# Patient Record
Sex: Male | Born: 1960 | Race: Black or African American | Hispanic: No | Marital: Married | Smoking: Former smoker
Health system: Southern US, Community
[De-identification: ages and names within clinical notes are randomized; demographics above are authoritative.]

## PROBLEM LIST (undated history)

## (undated) DIAGNOSIS — I10 Essential (primary) hypertension: Secondary | ICD-10-CM

## (undated) DIAGNOSIS — C801 Malignant (primary) neoplasm, unspecified: Secondary | ICD-10-CM

## (undated) DIAGNOSIS — F32A Depression, unspecified: Secondary | ICD-10-CM

## (undated) DIAGNOSIS — F419 Anxiety disorder, unspecified: Secondary | ICD-10-CM

## (undated) DIAGNOSIS — G4730 Sleep apnea, unspecified: Secondary | ICD-10-CM

---

## 2000-05-07 ENCOUNTER — Emergency Department (HOSPITAL_COMMUNITY): Admission: EM | Admit: 2000-05-07 | Discharge: 2000-05-07 | Payer: Self-pay | Admitting: Emergency Medicine

## 2000-05-07 ENCOUNTER — Encounter: Payer: Self-pay | Admitting: Emergency Medicine

## 2005-09-23 ENCOUNTER — Emergency Department (HOSPITAL_COMMUNITY): Admission: EM | Admit: 2005-09-23 | Discharge: 2005-09-23 | Payer: Self-pay | Admitting: Family Medicine

## 2005-09-25 ENCOUNTER — Ambulatory Visit: Payer: Self-pay | Admitting: Internal Medicine

## 2005-10-09 ENCOUNTER — Ambulatory Visit: Payer: Self-pay | Admitting: Internal Medicine

## 2006-07-05 ENCOUNTER — Emergency Department (HOSPITAL_COMMUNITY): Admission: EM | Admit: 2006-07-05 | Discharge: 2006-07-05 | Payer: Self-pay | Admitting: Emergency Medicine

## 2006-07-21 ENCOUNTER — Encounter: Admission: RE | Admit: 2006-07-21 | Discharge: 2006-07-21 | Payer: Self-pay | Admitting: Orthopedic Surgery

## 2015-09-14 ENCOUNTER — Other Ambulatory Visit: Payer: Self-pay | Admitting: Occupational Medicine

## 2015-09-14 ENCOUNTER — Ambulatory Visit: Payer: Self-pay

## 2015-09-14 DIAGNOSIS — M25562 Pain in left knee: Secondary | ICD-10-CM

## 2021-02-10 ENCOUNTER — Ambulatory Visit (HOSPITAL_COMMUNITY)
Admission: EM | Admit: 2021-02-10 | Discharge: 2021-02-10 | Disposition: A | Discharge status: Home / Self Care | Payer: Medicare Other | Attending: Emergency Medicine | Admitting: Emergency Medicine

## 2021-02-10 ENCOUNTER — Encounter (HOSPITAL_COMMUNITY): Payer: Self-pay

## 2021-02-10 ENCOUNTER — Ambulatory Visit (INDEPENDENT_AMBULATORY_CARE_PROVIDER_SITE_OTHER): Payer: Medicare Other

## 2021-02-10 ENCOUNTER — Other Ambulatory Visit: Payer: Self-pay

## 2021-02-10 DIAGNOSIS — N200 Calculus of kidney: Secondary | ICD-10-CM

## 2021-02-10 DIAGNOSIS — R10A0 Flank pain, unspecified side: Secondary | ICD-10-CM

## 2021-02-10 DIAGNOSIS — R109 Unspecified abdominal pain: Secondary | ICD-10-CM | POA: Diagnosis not present

## 2021-02-10 LAB — BASIC METABOLIC PANEL
Anion gap: 6 (ref 5–15)
BUN: 11 mg/dL (ref 6–20)
CO2: 26 mmol/L (ref 22–32)
Calcium: 9.1 mg/dL (ref 8.9–10.3)
Chloride: 105 mmol/L (ref 98–111)
Creatinine, Ser: 1.24 mg/dL (ref 0.61–1.24)
GFR, Estimated: >60 mL/min (ref >60)
Glucose, Bld: 104 mg/dL — ABNORMAL HIGH (ref 70–99)
Potassium: 4.4 mmol/L (ref 3.5–5.1)
Sodium: 137 mmol/L (ref 135–145)

## 2021-02-10 LAB — POCT URINALYSIS DIPSTICK, ED / UC
Glucose, UA: NEGATIVE mg/dL
Ketones, ur: 40 mg/dL — AB
Leukocytes,Ua: NEGATIVE
Nitrite: POSITIVE — AB
Protein, ur: 30 mg/dL — AB
Specific Gravity, Urine: >=1.03 (ref 1.005–1.030)
Urobilinogen, UA: 0.2 mg/dL (ref 0.0–1.0)
pH: 5 (ref 5.0–8.0)

## 2021-02-10 NOTE — ED Triage Notes (Signed)
Pt reports right  flank paini and right sided lower back pain x 4 days. Pain worse when siting. Ibuprofen gives relief. Denies dysuria, increase urinary frequency.

## 2021-02-10 NOTE — ED Provider Notes (Signed)
[facility]    CSN: [id number] Arrival date & time: 02/10/21  0825      History   Chief Complaint Chief Complaint  Patient presents with  . Back Pain  . Flank Pain    HPI CAMBRIDGE DELEO is a 60 y.o. male.   Patient here for evaluation of right flank or side pain that has been ongoing for the past several days.  Reports taking ibuprofen with some relief.  Reports pain is worse when sitting.  Has tried stretching and gentle exercise with no relief.  Denies any dysuria, urgency, or frequently.  Denies any difficulty urinating or hematuria.  Denies any fevers, chest pain, shortness of breath, N/V/D, numbness, tingling, weakness, abdominal pain, or headaches.   ROS: As per HPI, all other pertinent ROS negative   The history is provided by the patient.  Back Pain Flank Pain    History reviewed. No pertinent past medical history.  There are no problems to display for this patient.   Past Surgical History:  Procedure Laterality Date  . KNEE SURGERY Left        Home Medications    Prior to Admission medications   Medication Sig Start Date End Date Taking? Authorizing Provider  ibuprofen (ADVIL) 200 MG tablet Take 200 mg by mouth every 6 (six) hours as needed.   Yes [provider]    Family History History reviewed. No pertinent family history.  Social History Social History   Tobacco Use  . Smoking status: Never Smoker  . Smokeless tobacco: Never Used  Substance Use Topics  . Alcohol use: Never  . Drug use: Never     Allergies   Patient has no known allergies.   Review of Systems Review of Systems  Genitourinary: Positive for flank pain.  Musculoskeletal: Positive for back pain.  All other systems reviewed and are negative.    Physical Exam Triage Vital Signs ED Triage Vitals  Enc Vitals Group     BP 02/10/21 0941 (!) 144/80     Pulse Rate 02/10/21 0941 83     Resp 02/10/21 0941 20     Temp 02/10/21 0941 98.2 F (36.8  C)     Temp Source 02/10/21 0941 Oral     SpO2 02/10/21 0941 98 %     Weight --      Height --      Head Circumference --      Peak Flow --      Pain Score 02/10/21 0939 6     Pain Loc --      Pain Edu? --      Excl. in [place]? --    No data found.  Updated Vital Signs BP (!) 144/80 (BP Location: Right Arm)   Pulse 83   Temp 98.2 F (36.8 C) (Oral)   Resp 20   SpO2 98%   Visual Acuity Right Eye Distance:   Left Eye Distance:   Bilateral Distance:    Right Eye Near:   Left Eye Near:    Bilateral Near:     Physical Exam Vitals and nursing note reviewed.  Constitutional:      General: He is not in acute distress.    Appearance: Normal appearance. He is not ill-appearing, toxic-appearing or diaphoretic.  HENT:     Head: Normocephalic and atraumatic.  Eyes:     Conjunctiva/sclera: Conjunctivae normal.  Cardiovascular:     Rate and Rhythm: Normal rate.     Pulses: Normal pulses.  Pulmonary:     Effort: Pulmonary effort is normal.  Abdominal:     General: Abdomen is flat.     Tenderness: There is no right CVA tenderness or left CVA tenderness.  Musculoskeletal:        General: Normal range of motion.     Cervical back: Normal range of motion.  Skin:    General: Skin is warm and dry.  Neurological:     General: No focal deficit present.     Mental Status: He is alert and oriented to person, place, and time.  Psychiatric:        Mood and Affect: Mood normal.      UC Treatments / Results  Labs (all labs ordered are listed, but only abnormal results are displayed) Labs Reviewed  POCT URINALYSIS DIPSTICK, ED / UC - Abnormal; Notable for the following components:      Result Value   Bilirubin Urine SMALL (*)    Ketones, ur 40 (*)    Hgb urine dipstick LARGE (*)    Protein, ur 30 (*)    Nitrite POSITIVE (*)    All other components within normal limits  URINE CULTURE  BASIC METABOLIC PANEL    EKG   Radiology DG Abd 2 Views  Result Date:  02/10/2021 CLINICAL DATA:  Right flank pain. EXAM: ABDOMEN - 2 VIEW COMPARISON:  None. FINDINGS: Nonobstructive bowel gas pattern. Large amount of stool in the right abdomen and transverse colon region. There is at least 1 stone in the left kidney region that measures up to 9 mm. Evidence for 3 calcifications in the right renal region, largest measuring 7 mm. Multiple calcifications in the pelvis likely represent phleboliths. IMPRESSION: 1. Bilateral renal calculi. 2. Nonobstructive bowel gas pattern. Large amount of stool in the abdomen. Electronically Signed   By: Markus Daft M.D.   On: 02/10/2021 10:24    Procedures Procedures (including critical care time)  Medications Ordered in UC Medications - No data to display  Initial Impression / Assessment and Plan / UC Course  I have reviewed the triage vital signs and the nursing notes.  Pertinent labs & imaging results that were available during my care of the patient were reviewed by me and considered in my medical decision making (see chart for details).     Assessment primarily unremarkable with no red flags or concerns.  Urinalysis was positive for nitrites, bilirubin, ketones, protein, and hemoglobin.  Urine culture pending.  Abdominal x-ray shows bilateral renal calculi and a large amount of stool.  As patient denies any difficulty urinating at this time encouraged him to increase fluid intake.  May continue taking ibuprofen as needed for pain.  Will obtain BMP to ensure adequate kidney function.  Patient does not have primary care so primary care assistance started.  May require to urology in the future.  Strict ED follow-up for any worsening symptoms including development of fever, abdominal pain, nausea/vomiting, hematuria, or difficulty urinating.  Final Clinical Impressions(s) / UC Diagnoses   Final diagnoses:  Flank pain  Kidney stone     Discharge Instructions     Continue to drink plenty of water.  You can continue to take  ibuprofen as needed for pain.  If you develop any fevers, abdominal pain, nausea or vomiting, blood in your urine, or difficulty urinating please go directly to the emergency room for further evaluation.   Get established with a primary care provider for long-term health management.     ED Prescriptions  None     PDMP not reviewed this encounter.   Pearson Forster, NP 02/10/21 1101

## 2021-02-10 NOTE — Discharge Instructions (Addendum)
Continue to drink plenty of water.  You can continue to take ibuprofen as needed for pain.  If you develop any fevers, abdominal pain, nausea or vomiting, blood in your urine, or difficulty urinating please go directly to the emergency room for further evaluation.   Get established with a primary care provider for long-term health management.

## 2021-02-11 LAB — URINE CULTURE: Culture: NO GROWTH

## 2021-02-13 ENCOUNTER — Telehealth (HOSPITAL_BASED_OUTPATIENT_CLINIC_OR_DEPARTMENT_OTHER): Payer: Self-pay

## 2021-02-13 NOTE — Telephone Encounter (Signed)
-----   Message from Curt Jews, RN sent at 02/10/2021 11:22 AM EDT ----- Regarding: UC to PCP Patient needs to establish with PCP - routine

## 2021-03-30 ENCOUNTER — Other Ambulatory Visit: Payer: Self-pay | Admitting: Otolaryngology

## 2021-04-11 ENCOUNTER — Encounter (HOSPITAL_COMMUNITY): Payer: Self-pay | Admitting: Otolaryngology

## 2021-04-11 NOTE — Progress Notes (Addendum)
  I spoke to [name], patient is scheduled for Endoscopy to evaluate to see if patient is a candidate for an Inspire for sleep apnea. When asked to confirm his name and date of birth, it was not correct, this patient's name is Brent Brewer, date of birth is 05/04/1961.  The picture is not the patient I was speaking to Corrie Dandy  I left a voice message on [facility] for Dr Redmond Baseman.Corrie Dandy Goede denies chest pain or shortness of breath. Patient denies s/s of Covid and has not been in contact with anyone who has s/s.  Mr. Rankin Coolman does not have a PCP.

## 2021-04-11 NOTE — Progress Notes (Addendum)
According to Care Everywhere, Mr. Brent Brewer seen  at [facility] in  March 2022, I have requested records.

## 2021-04-12 ENCOUNTER — Ambulatory Visit (HOSPITAL_COMMUNITY): Admission: RE | Admit: 2021-04-12 | Payer: Medicare Other | Source: Home / Self Care | Admitting: Otolaryngology

## 2021-04-12 ENCOUNTER — Encounter (HOSPITAL_COMMUNITY): Payer: Self-pay | Admitting: Anesthesiology

## 2021-04-12 HISTORY — DX: Essential (primary) hypertension: I10

## 2021-04-12 HISTORY — DX: Malignant (primary) neoplasm, unspecified: C80.1

## 2021-04-12 HISTORY — DX: Anxiety disorder, unspecified: F41.9

## 2021-04-12 HISTORY — DX: Sleep apnea, unspecified: G47.30

## 2021-04-12 HISTORY — DX: Depression, unspecified: F32.A

## 2021-04-12 SURGERY — DRUG INDUCED SLEEP ENDOSCOPY
Anesthesia: General

## 2021-04-12 NOTE — Anesthesia Preprocedure Evaluation (Deleted)
Anesthesia Evaluation    Reviewed: Allergy & Precautions, Patient's Chart, lab work & pertinent test results  Airway        Dental   Pulmonary sleep apnea , former smoker,           Cardiovascular hypertension, Pt. on medications      Neuro/Psych PSYCHIATRIC DISORDERS Anxiety Depression negative neurological ROS     GI/Hepatic negative GI ROS, Neg liver ROS,   Endo/Other  negative endocrine ROS  Renal/GU negative Renal ROS  negative genitourinary   Musculoskeletal negative musculoskeletal ROS (+)   Abdominal   Peds  Hematology negative hematology ROS (+)   Anesthesia Other Findings   Reproductive/Obstetrics                             Anesthesia Physical Anesthesia Plan  ASA: 3  Anesthesia Plan: General   Post-op Pain Management:    Induction: Intravenous  PONV Risk Score and Plan: 2 and Propofol infusion and Treatment may vary due to age or medical condition  Airway Management Planned: Natural Airway and Mask  Additional Equipment:   Intra-op Plan:   Post-operative Plan:   Informed Consent: I have reviewed the patients History and Physical, chart, labs and discussed the procedure including the risks, benefits and alternatives for the proposed anesthesia with the patient or authorized representative who has indicated his/her understanding and acceptance.     Dental advisory given  Plan Discussed with: CRNA  Anesthesia Plan Comments:         Anesthesia Quick Evaluation

## 2021-05-19 IMAGING — DX DG ABDOMEN 2V
3 series · 3 of 3 positions shown · non-contrast
Comparison: None.

CLINICAL DATA: Right flank pain.

EXAM:
ABDOMEN - 2 VIEW

[abdomen erect]
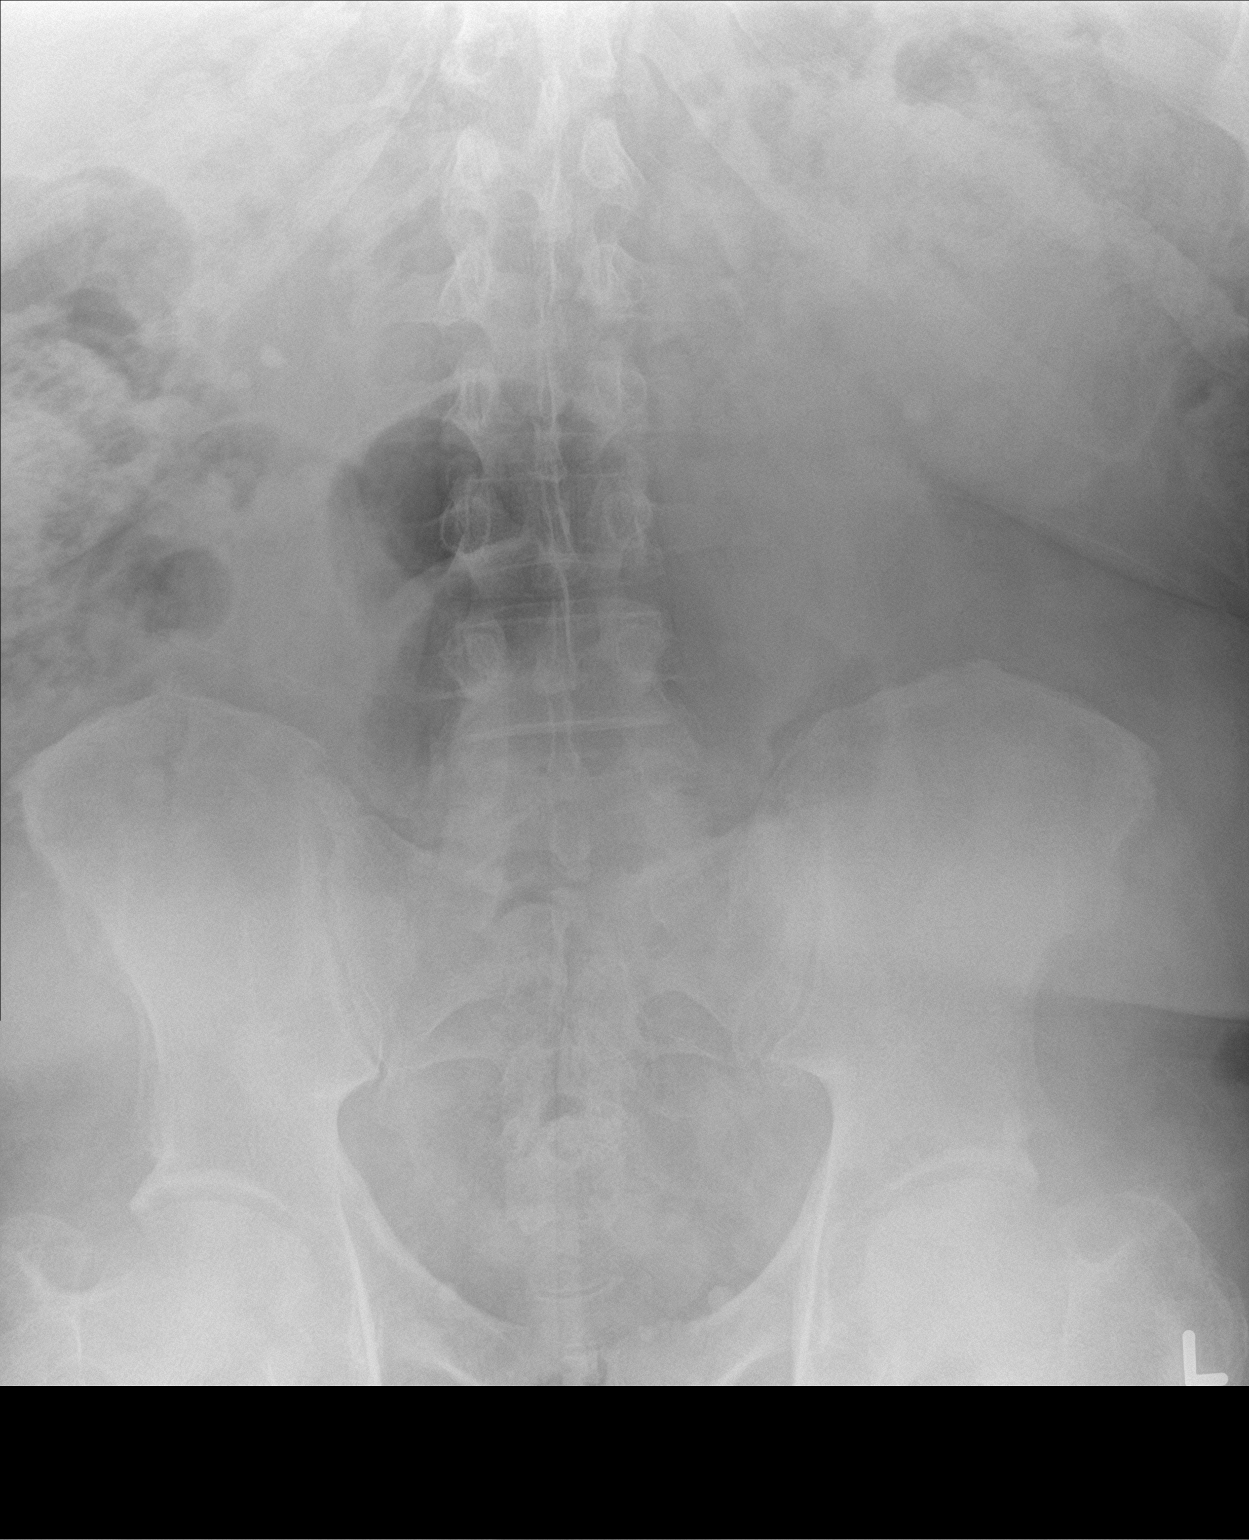

[abdomen supine (1 of 2)]
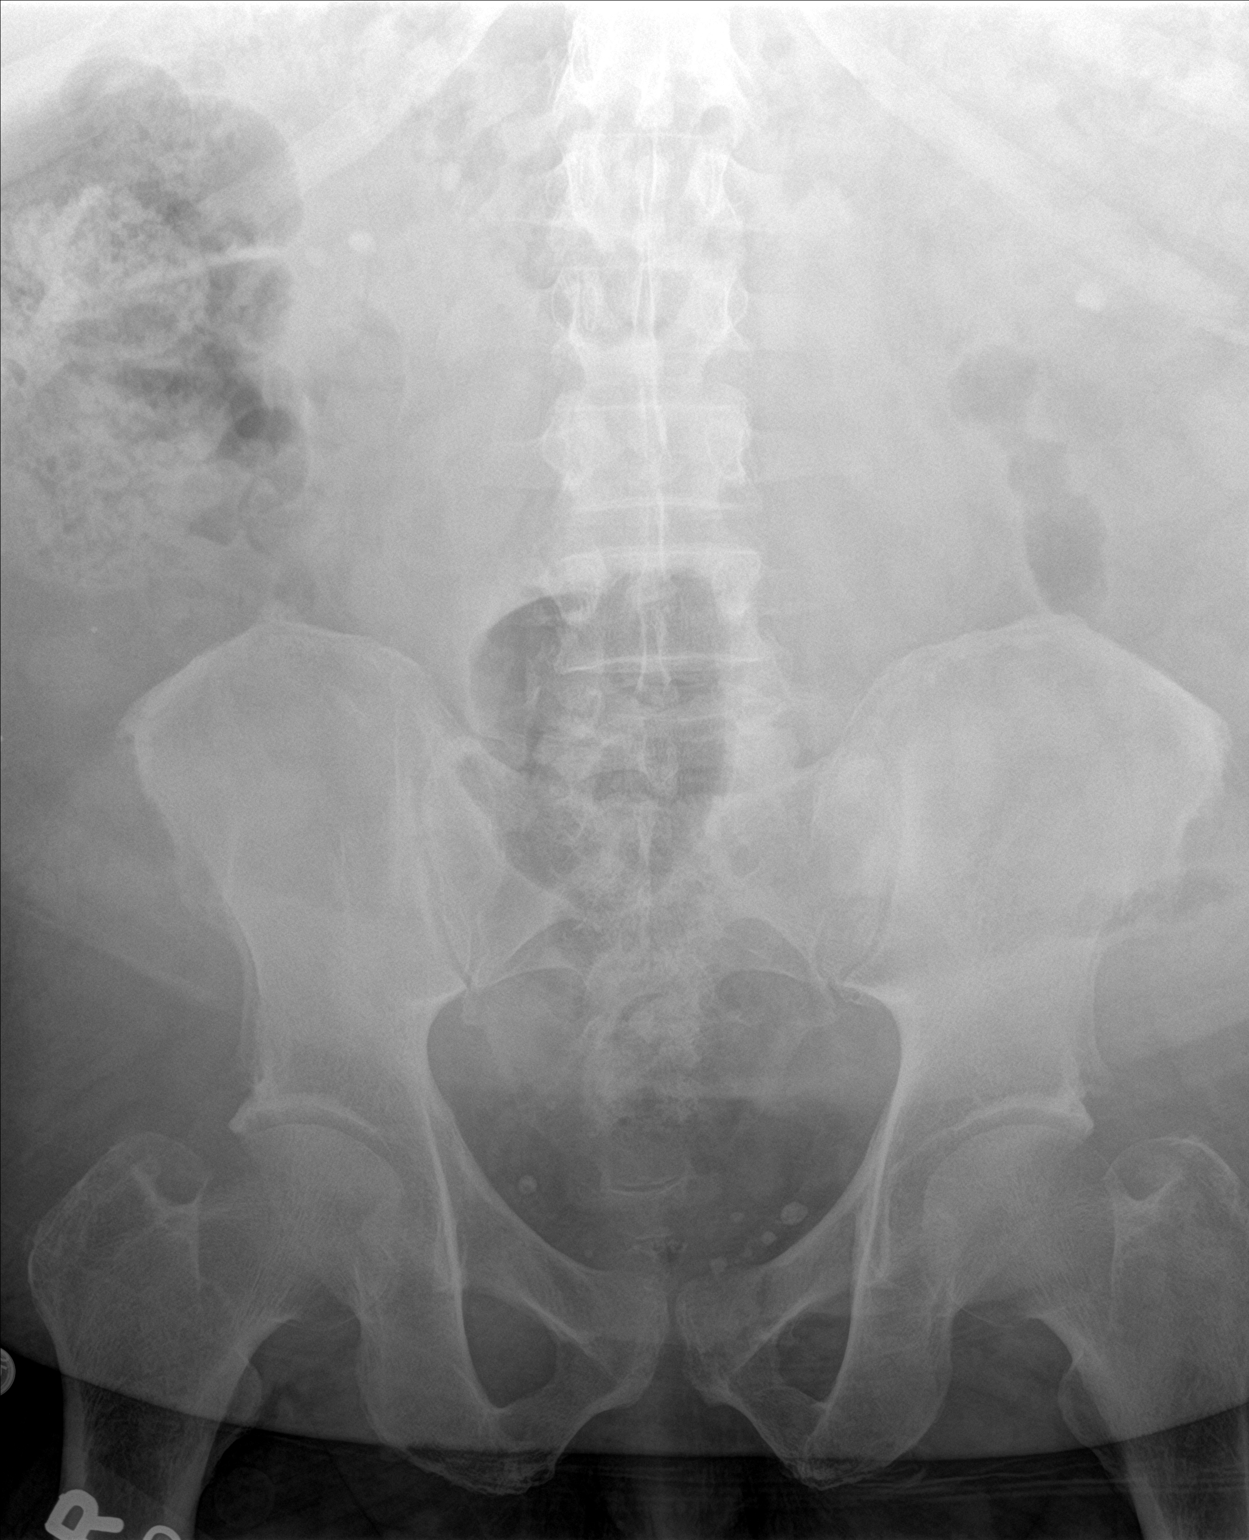

[abdomen supine (2 of 2)]
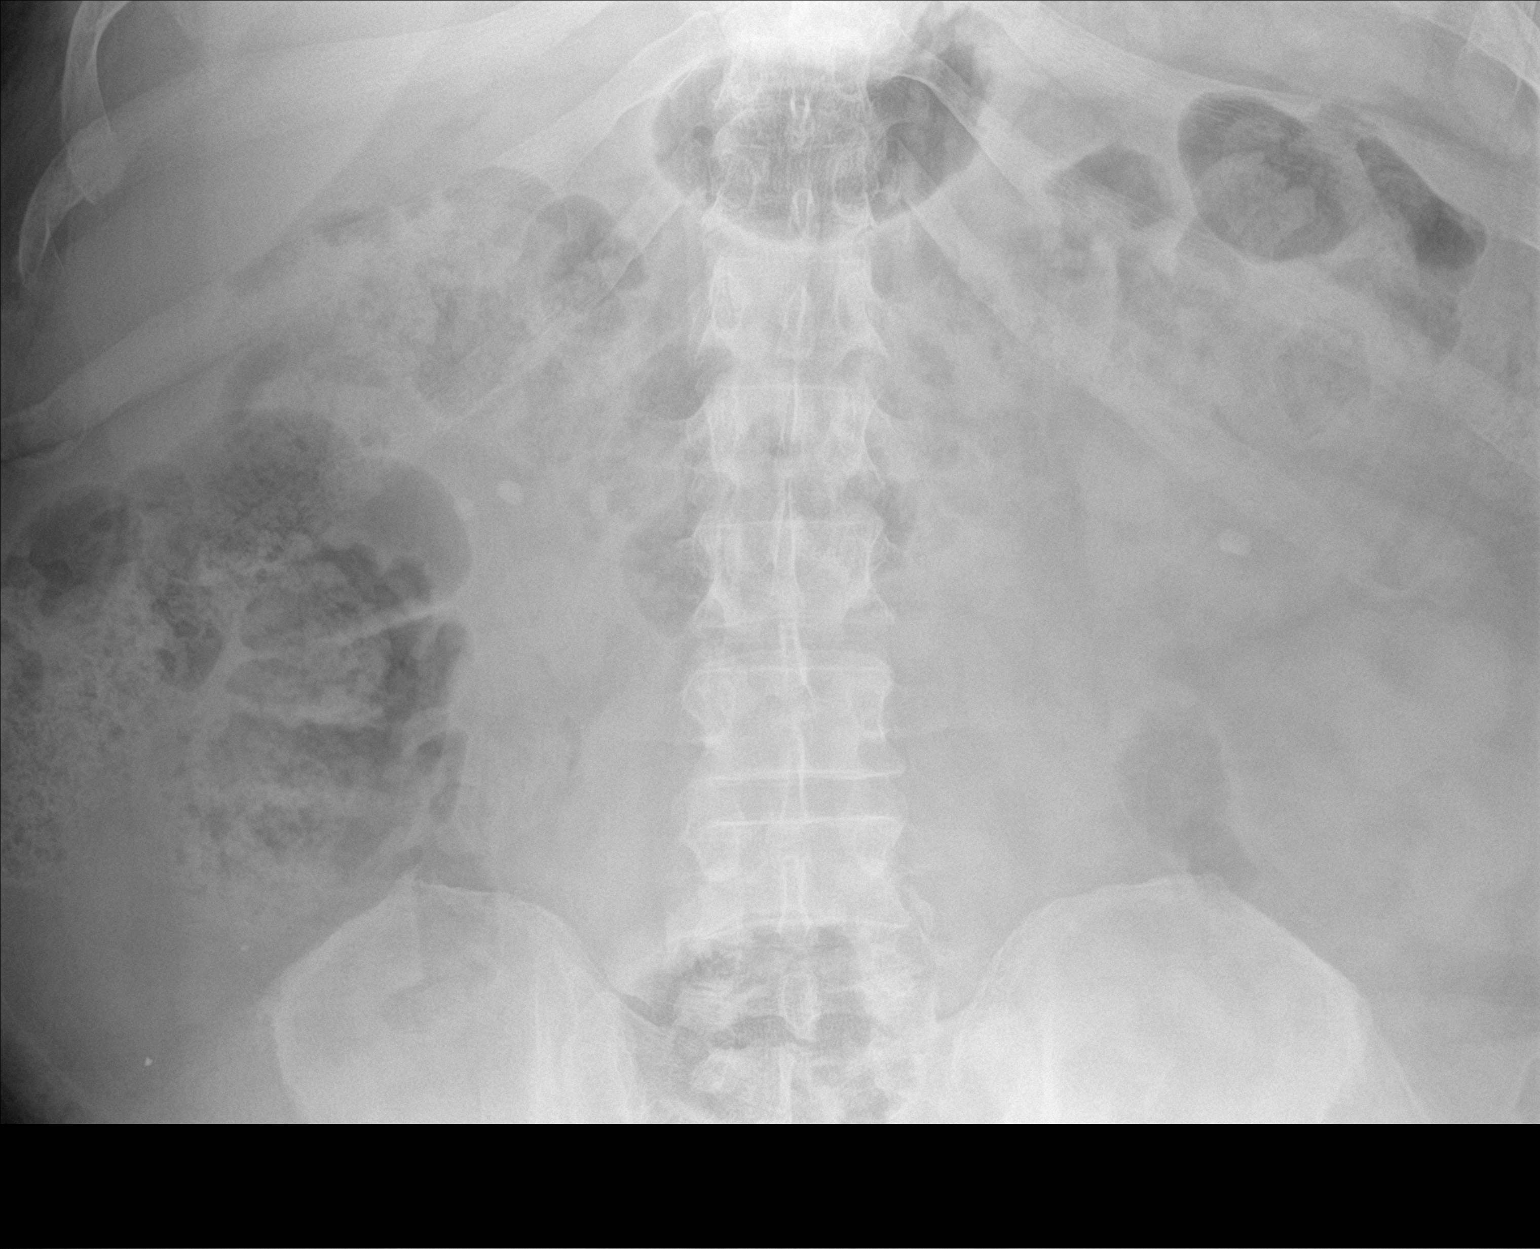

[3 of 3 positions shown; findings below may reference images not displayed]

FINDINGS: Nonobstructive bowel gas pattern. Large amount of stool in the right
abdomen and transverse colon region. There is at least 1 stone in
the left kidney region that measures up to 9 mm. Evidence for 3
calcifications in the right renal region, largest measuring 7 mm.
Multiple calcifications in the pelvis likely represent phleboliths.
IMPRESSION: 1. Bilateral renal calculi.
2. Nonobstructive bowel gas pattern. Large amount of stool in the
abdomen.

## 2022-08-17 ENCOUNTER — Other Ambulatory Visit: Payer: Self-pay | Admitting: Orthopedic Surgery

## 2022-08-17 DIAGNOSIS — M25511 Pain in right shoulder: Secondary | ICD-10-CM

## 2022-09-07 ENCOUNTER — Ambulatory Visit
Admission: RE | Admit: 2022-09-07 | Discharge: 2022-09-07 | Disposition: A | Discharge status: Home / Self Care | Payer: Medicare Other | Source: Ambulatory Visit | Attending: Orthopedic Surgery | Admitting: Orthopedic Surgery

## 2022-09-07 DIAGNOSIS — M25511 Pain in right shoulder: Secondary | ICD-10-CM
